# Patient Record
Sex: Female | Born: 2000 | Race: Black or African American | Hispanic: No | Marital: Single | Smoking: Never smoker
Health system: Southern US, Community
[De-identification: ages and names within clinical notes are randomized; demographics above are authoritative.]

## PROBLEM LIST (undated history)

## (undated) DIAGNOSIS — Z789 Other specified health status: Secondary | ICD-10-CM

## (undated) HISTORY — DX: Other specified health status: Z78.9

## (undated) HISTORY — PX: NO PAST SURGERIES: SHX2092

---
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
## 2023-10-21 ENCOUNTER — Encounter: Admitting: Obstetrics and Gynecology
